# Patient Record
Sex: Female | Born: 1970
Health system: Southern US, Community
[De-identification: ages and names within clinical notes are randomized; demographics above are authoritative.]

## PROBLEM LIST (undated history)

## (undated) DIAGNOSIS — I10 Essential (primary) hypertension: Secondary | ICD-10-CM

## (undated) DIAGNOSIS — L400 Psoriasis vulgaris: Secondary | ICD-10-CM

## (undated) HISTORY — PX: TUBAL LIGATION: SHX77

---

## 2015-11-22 ENCOUNTER — Encounter (HOSPITAL_BASED_OUTPATIENT_CLINIC_OR_DEPARTMENT_OTHER): Payer: Self-pay | Admitting: *Deleted

## 2015-11-22 ENCOUNTER — Emergency Department (HOSPITAL_BASED_OUTPATIENT_CLINIC_OR_DEPARTMENT_OTHER)
Admission: EM | Admit: 2015-11-22 | Discharge: 2015-11-22 | Disposition: A | Discharge status: Home / Self Care | Payer: Self-pay | Attending: Emergency Medicine | Admitting: Emergency Medicine

## 2015-11-22 DIAGNOSIS — B356 Tinea cruris: Secondary | ICD-10-CM | POA: Insufficient documentation

## 2015-11-22 DIAGNOSIS — F17200 Nicotine dependence, unspecified, uncomplicated: Secondary | ICD-10-CM | POA: Insufficient documentation

## 2015-11-22 DIAGNOSIS — Z79899 Other long term (current) drug therapy: Secondary | ICD-10-CM | POA: Insufficient documentation

## 2015-11-22 DIAGNOSIS — I10 Essential (primary) hypertension: Secondary | ICD-10-CM | POA: Insufficient documentation

## 2015-11-22 DIAGNOSIS — R21 Rash and other nonspecific skin eruption: Secondary | ICD-10-CM

## 2015-11-22 HISTORY — DX: Essential (primary) hypertension: I10

## 2015-11-22 LAB — COMPREHENSIVE METABOLIC PANEL
ALBUMIN: 4.1 g/dL (ref 3.5–5.0)
ALK PHOS: 55 U/L (ref 38–126)
ALT: 14 U/L (ref 14–54)
ANION GAP: 6 (ref 5–15)
AST: 17 U/L (ref 15–41)
BILIRUBIN TOTAL: 0.5 mg/dL (ref 0.3–1.2)
BUN: 11 mg/dL (ref 6–20)
CALCIUM: 8.7 mg/dL — AB (ref 8.9–10.3)
CO2: 26 mmol/L (ref 22–32)
Chloride: 106 mmol/L (ref 101–111)
Creatinine, Ser: 0.69 mg/dL (ref 0.44–1.00)
GFR calc Af Amer: >60 mL/min (ref >60)
GFR calc non Af Amer: >60 mL/min (ref >60)
GLUCOSE: 88 mg/dL (ref 65–99)
Potassium: 3.9 mmol/L (ref 3.5–5.1)
SODIUM: 138 mmol/L (ref 135–145)
TOTAL PROTEIN: 7.5 g/dL (ref 6.5–8.1)

## 2015-11-22 LAB — RAPID HIV SCREEN (HIV 1/2 AB+AG)
HIV 1/2 Antibodies: NONREACTIVE
HIV-1 P24 Antigen - HIV24: NONREACTIVE

## 2015-11-22 LAB — CBC WITH DIFFERENTIAL/PLATELET
BASOS ABS: 0 10*3/uL (ref 0.0–0.1)
BASOS PCT: 0 %
EOS ABS: 0.1 10*3/uL (ref 0.0–0.7)
Eosinophils Relative: 1 %
HEMATOCRIT: 37.2 % (ref 36.0–46.0)
HEMOGLOBIN: 13.1 g/dL (ref 12.0–15.0)
Lymphocytes Relative: 49 %
Lymphs Abs: 2.4 10*3/uL (ref 0.7–4.0)
MCH: 31 pg (ref 26.0–34.0)
MCHC: 35.2 g/dL (ref 30.0–36.0)
MCV: 87.9 fL (ref 78.0–100.0)
Monocytes Absolute: 0.4 10*3/uL (ref 0.1–1.0)
Monocytes Relative: 9 %
NEUTROS ABS: 2 10*3/uL (ref 1.7–7.7)
NEUTROS PCT: 41 %
Platelets: 303 10*3/uL (ref 150–400)
RBC: 4.23 MIL/uL (ref 3.87–5.11)
RDW: 13.1 % (ref 11.5–15.5)
WBC: 4.9 10*3/uL (ref 4.0–10.5)

## 2015-11-22 MED ORDER — KETOCONAZOLE 2 % EX CREA: 1.0000 "application " | TOPICAL_CREAM | Freq: Every day | CUTANEOUS | 0 refills | Status: AC

## 2015-11-22 MED ORDER — SODIUM CHLORIDE 0.9 % IV BOLUS (SEPSIS)
1000.0000 mL | Freq: Once | INTRAVENOUS | Status: AC
  Administered 2015-11-22: 1000 mL via INTRAVENOUS

## 2015-11-22 MED ORDER — HYDROXYZINE HCL 25 MG PO TABS: 25.0000 mg | ORAL_TABLET | Freq: Four times a day (QID) | ORAL | 0 refills | Status: DC

## 2015-11-22 MED ORDER — FLUCONAZOLE 50 MG PO TABS
150.0000 mg | ORAL_TABLET | Freq: Every day | ORAL | Status: DC
  Administered 2015-11-22: 150 mg via ORAL
  Filled 2015-11-22: qty 1

## 2015-11-22 MED ORDER — DIPHENHYDRAMINE HCL 50 MG/ML IJ SOLN
25.0000 mg | Freq: Once | INTRAMUSCULAR | Status: AC
  Administered 2015-11-22: 25 mg via INTRAVENOUS
  Filled 2015-11-22: qty 1

## 2015-11-22 MED FILL — hydrOXYzine HCL 25 MG TABS: 25 | 3 days supply | Qty: 12 | Fill #0

## 2015-11-22 MED FILL — KETOCONAZOLE 2% CREAM: 2 | 14 days supply | Qty: 30 | Fill #0

## 2015-11-22 NOTE — ED Provider Notes (Signed)
[facility] Provider Note   CSN: [id number] Arrival date & time: 11/22/15  1217  First Provider Contact:  None       History   Chief Complaint Chief Complaint  Patient presents with  . Rash    HPI   Patient with pruritic rash beginning on the lower legs 2 weeks ago and spreading up to her vulva, groin, and lower abdomen where it is now most concentrated. Also the right palm and BL arms. Denies contacts with similar,changes in lotions/soaps/detergents, exposure to animal or plant irritants, and denies purulent discharge. Tried atarax and    The history is provided by the patient.  Rash   This is a new problem. Episode onset: 2 weeks ago. The problem has been rapidly worsening. The problem is associated with an unknown factor. There has been no fever. The rash is present on the genitalia, right hand, right arm, left arm, left upper leg, left lower leg, left ankle, right upper leg, right lower leg, right ankle and abdomen. The pain is at a severity of 0/10. Associated symptoms include itching. She has tried steriods and antihistamines for the symptoms. The treatment provided no relief.    Past Medical History:  Diagnosis Date  . Hypertension     There are no active problems to display for this patient.   Past Surgical History:  Procedure Laterality Date  . TUBAL LIGATION      OB History    No data available       Home Medications    Prior to Admission medications   Medication Sig Start Date End Date Taking? Authorizing Provider  hydrochlorothiazide (HYDRODIURIL) 25 MG tablet Take 25 mg by mouth daily.   Yes Historical Provider, MD    Family History No family history on file.  Social History Social History  Substance Use Topics  . Smoking status: Current Every Day Smoker  . Smokeless tobacco: Never Used  . Alcohol use No     Allergies   Penicillins   Review of Systems Review of Systems  Constitutional: Negative for fever.  Skin:  Positive for itching and rash.  Ten systems reviewed and are negative for acute change, except as noted in the HPI.     Physical Exam Updated Vital Signs BP 174/88 (BP Location: Right Arm)   Pulse (!) 50   Temp 99.1 F (37.3 C) (Oral)   Resp 18   Ht  (1.6 m)   Wt 79.8 kg   LMP 11/17/2015   SpO2 100%   BMI 31.18 kg/m   Physical Exam  Constitutional: She is oriented to person, place, and time. She appears well-developed and well-nourished. No distress.  HENT:  Head: Normocephalic and atraumatic.  Eyes: Conjunctivae are normal. No scleral icterus.  Neck: Normal range of motion.  Cardiovascular: Normal rate, regular rhythm and normal heart sounds.  Exam reveals no gallop and no friction rub.   No murmur heard. Pulmonary/Chest: Effort normal and breath sounds normal. No respiratory distress.  Abdominal: Soft. Bowel sounds are normal. She exhibits no distension and no mass. There is no tenderness. There is no guarding.  Neurological: She is alert and oriented to person, place, and time.  Skin: Skin is warm and dry. Rash noted. She is not diaphoretic.  Multiple singular, hyperpigmented plaques, and coin like lesions,  of varying size involving the legs, vulva, lower abdomen, arms, and the R palm. The vulva also has extension of hypertropic, hyprpigmented skin into the groin and medial thighs  that is well demarcated  Nursing note and vitals reviewed.          ED Treatments / Results  Labs (all labs ordered are listed, but only abnormal results are displayed) Labs Reviewed  CBC WITH DIFFERENTIAL/PLATELET  COMPREHENSIVE METABOLIC PANEL  RAPID HIV SCREEN (HIV 1/2 AB+AG)  RPR    EKG  EKG Interpretation None       Radiology No results found.  Procedures Procedures (including critical care time)  Medications Ordered in ED Medications  sodium chloride 0.9 % bolus 1,000 mL (not administered)  diphenhydrAMINE (BENADRYL) injection 25 mg (not administered)      Initial Impression / Assessment and Plan / ED Course  I have reviewed the triage vital signs and the nursing notes.  Pertinent labs & imaging results that were available during my care of the patient were reviewed by me and considered in my medical decision making (see chart for details).  Clinical Course    Patient with rash, labs reviewed and no significant abnormality, negative rapid HIV today. The patient's RPR is pending. I discussed possibility of syphilis as a cause of the rash on her legs, arms and hands. Patient's itching is predominantly in the groin area feels she has a kidney infection. She is given 1 dose of by mouth Diflucan and will be discharged with ketoconazole 2% cream for topical application in the groin and vulva. Discussed safe sexual practices, especially while her results are pending. Patient is encouraged to follow up with the dermatologist. Avoid letting others have direct contact with the lesions until diagnosis is achieved. Discussed return precautions with the patient to include worsening swelling, redness, pain, discharge or other signs of cellulitis or abscess. She appears safe for discharge at this time.  Final Clinical Impressions(s) / ED Diagnoses   Final diagnoses:  None    New Prescriptions New Prescriptions   No medications on file     Arthor Captainbigail Kacin Dancy, PA-C 11/22/15 1631    Charlynne Panderavid Hsienta Yao, MD 11/23/15 [phone]

## 2015-11-22 NOTE — ED Triage Notes (Signed)
Rash for several weeks. Itching. Has dermatologly appt on 8/25. Denies fever

## 2015-11-23 LAB — RPR: RPR: NONREACTIVE

## 2020-02-18 ENCOUNTER — Emergency Department (HOSPITAL_BASED_OUTPATIENT_CLINIC_OR_DEPARTMENT_OTHER)
Admission: EM | Admit: 2020-02-18 | Discharge: 2020-02-18 | Disposition: A | Discharge status: Home / Self Care | Payer: Self-pay | Attending: Emergency Medicine | Admitting: Emergency Medicine

## 2020-02-18 ENCOUNTER — Other Ambulatory Visit (HOSPITAL_BASED_OUTPATIENT_CLINIC_OR_DEPARTMENT_OTHER): Payer: Self-pay | Admitting: Emergency Medicine

## 2020-02-18 ENCOUNTER — Other Ambulatory Visit: Payer: Self-pay

## 2020-02-18 ENCOUNTER — Encounter (HOSPITAL_BASED_OUTPATIENT_CLINIC_OR_DEPARTMENT_OTHER): Payer: Self-pay

## 2020-02-18 DIAGNOSIS — I10 Essential (primary) hypertension: Secondary | ICD-10-CM | POA: Insufficient documentation

## 2020-02-18 DIAGNOSIS — Z9851 Tubal ligation status: Secondary | ICD-10-CM | POA: Insufficient documentation

## 2020-02-18 DIAGNOSIS — Z79899 Other long term (current) drug therapy: Secondary | ICD-10-CM | POA: Insufficient documentation

## 2020-02-18 DIAGNOSIS — K644 Residual hemorrhoidal skin tags: Secondary | ICD-10-CM | POA: Insufficient documentation

## 2020-02-18 DIAGNOSIS — Z87891 Personal history of nicotine dependence: Secondary | ICD-10-CM | POA: Insufficient documentation

## 2020-02-18 HISTORY — DX: Psoriasis vulgaris: L40.0

## 2020-02-18 MED ORDER — HYDROCODONE-ACETAMINOPHEN 5-325 MG PO TABS: 1.0000 | ORAL_TABLET | Freq: Four times a day (QID) | ORAL | 0 refills | Status: DC | PRN

## 2020-02-18 MED FILL — HYDROCODON-APAP 5-325: 5-325 | 4 days supply | Qty: 14 | Fill #0

## 2020-02-18 NOTE — Discharge Instructions (Signed)
Work-up today shows evidence of an external hemorrhoid that probably has a component of thrombosis in it.  Take the pain medicine as directed.  Follow-up with your doctors or return for any new or worse symptoms.  Also would recommend soaking in warm tub water for 20 minutes once or twice a day.

## 2020-02-18 NOTE — ED Triage Notes (Signed)
Pt c/o hemorrhoids x "years" -worse x 2 days-NAD-steady gait

## 2020-02-18 NOTE — ED Provider Notes (Signed)
[facility] EMERGENCY DEPARTMENT Provider Note   CSN: [id number] Arrival date & time: 02/18/20  1131     History Chief Complaint  Patient presents with  . Hemorrhoids    Bridgett Golberg is a 49 y.o. female.  Patient with complaint of anal pain for 2 to 3 days.  Patient feels that it secondary to hemorrhoid problem.  No bleeding.  No fever.        Past Medical History:  Diagnosis Date  . Hypertension   . Plaque psoriasis     There are no problems to display for this patient.   Past Surgical History:  Procedure Laterality Date  . TUBAL LIGATION       OB History   No obstetric history on file.     No family history on file.  Social History   Tobacco Use  . Smoking status: Former Games developer  . Smokeless tobacco: Never Used  Vaping Use  . Vaping Use: Never used  Substance Use Topics  . Alcohol use: No  . Drug use: No    Home Medications Prior to Admission medications   Medication Sig Start Date End Date Taking? Authorizing Provider  hydrochlorothiazide (HYDRODIURIL) 25 MG tablet Take 25 mg by mouth daily.    [provider]  HYDROcodone-acetaminophen (NORCO/VICODIN) 5-325 MG tablet Take 1 tablet by mouth every 6 (six) hours as needed for moderate pain. 02/18/20   Vanetta Mulders, MD  hydrOXYzine (ATARAX/VISTARIL) 25 MG tablet Take 1 tablet (25 mg total) by mouth every 6 (six) hours. 11/22/15   Arthor Captain, PA-C  ketoconazole (NIZORAL) 2 % cream Apply 1 application topically daily. Apply to the outside of the genitals and the groin. 11/22/15   Arthor Captain, PA-C    Allergies    Penicillins  Review of Systems   Review of Systems  Constitutional: Negative for chills and fever.  HENT: Negative for rhinorrhea and sore throat.   Eyes: Negative for visual disturbance.  Respiratory: Negative for cough and shortness of breath.   Cardiovascular: Negative for chest pain and leg swelling.  Gastrointestinal: Positive for rectal pain. Negative  for abdominal pain, blood in stool, diarrhea, nausea and vomiting.  Genitourinary: Negative for dysuria.  Musculoskeletal: Negative for back pain and neck pain.  Skin: Negative for rash.  Neurological: Negative for dizziness, light-headedness and headaches.  Hematological: Does not bruise/bleed easily.  Psychiatric/Behavioral: Negative for confusion.    Physical Exam Updated Vital Signs BP (!) 156/72 (BP Location: Right Arm)   Pulse 64   Temp 98.6 F (37 C) (Oral)   Resp 18   Ht 1.6 m (5\' 3" )   Wt 85.7 kg   LMP 11/17/2015   SpO2 100%   BMI 33.48 kg/m   Physical Exam Vitals and nursing note reviewed.  Constitutional:      General: She is not in acute distress.    Appearance: Normal appearance. She is well-developed.  HENT:     Head: Normocephalic and atraumatic.  Eyes:     Extraocular Movements: Extraocular movements intact.     Conjunctiva/sclera: Conjunctivae normal.     Pupils: Pupils are equal, round, and reactive to light.  Cardiovascular:     Rate and Rhythm: Normal rate and regular rhythm.     Heart sounds: No murmur heard.   Pulmonary:     Effort: Pulmonary effort is normal. No respiratory distress.     Breath sounds: Normal breath sounds.  Abdominal:     Palpations: Abdomen is soft.  Tenderness: There is no abdominal tenderness.  Genitourinary:    Comments: Perianal area with a large external hemorrhoid.  With some tenderness.  Node distinct of visual evidence of thrombosis.  But most likely has some component of that.  No bleeding.  Rectal exam not done due to the external hemorrhoid discomfort. Musculoskeletal:     Cervical back: Normal range of motion and neck supple.  Skin:    General: Skin is warm and dry.  Neurological:     General: No focal deficit present.     Mental Status: She is alert and oriented to person, place, and time.     Cranial Nerves: No cranial nerve deficit.     Sensory: No sensory deficit.     ED Results / Procedures /  Treatments   Labs (all labs ordered are listed, but only abnormal results are displayed) Labs Reviewed - No data to display  EKG None  Radiology No results found.  Procedures Procedures (including critical care time)  Medications Ordered in ED Medications - No data to display  ED Course  I have reviewed the triage vital signs and the nursing notes.  Pertinent labs & imaging results that were available during my care of the patient were reviewed by me and considered in my medical decision making (see chart for details).    MDM Rules/Calculators/A&P                           Exam consistent with external hemorrhoid probably with a degree of thrombosis.  But no direct evidence of thrombosis to open up.  No active bleeding.  Will treat symptomatically with pain medicine and soaks.  Patient will follow up with primary care provider return for any new or worse symptoms.     Final Clinical Impression(s) / ED Diagnoses Final diagnoses:  External hemorrhoid    Rx / DC Orders ED Discharge Orders         Ordered    HYDROcodone-acetaminophen (NORCO/VICODIN) 5-325 MG tablet  Every 6 hours PRN        02/18/20 1418           Vanetta Mulders, MD 02/18/20 1423

## 2020-05-20 ENCOUNTER — Emergency Department (HOSPITAL_BASED_OUTPATIENT_CLINIC_OR_DEPARTMENT_OTHER): Payer: BC Managed Care – PPO

## 2020-05-20 ENCOUNTER — Other Ambulatory Visit (HOSPITAL_BASED_OUTPATIENT_CLINIC_OR_DEPARTMENT_OTHER): Payer: Self-pay | Admitting: Emergency Medicine

## 2020-05-20 ENCOUNTER — Emergency Department (HOSPITAL_BASED_OUTPATIENT_CLINIC_OR_DEPARTMENT_OTHER)
Admission: EM | Admit: 2020-05-20 | Discharge: 2020-05-20 | Disposition: A | Discharge status: Home / Self Care | Payer: BC Managed Care – PPO | Attending: Emergency Medicine | Admitting: Emergency Medicine

## 2020-05-20 ENCOUNTER — Encounter (HOSPITAL_BASED_OUTPATIENT_CLINIC_OR_DEPARTMENT_OTHER): Payer: Self-pay | Admitting: *Deleted

## 2020-05-20 ENCOUNTER — Other Ambulatory Visit: Payer: Self-pay

## 2020-05-20 DIAGNOSIS — Z87891 Personal history of nicotine dependence: Secondary | ICD-10-CM | POA: Insufficient documentation

## 2020-05-20 DIAGNOSIS — M7989 Other specified soft tissue disorders: Secondary | ICD-10-CM | POA: Insufficient documentation

## 2020-05-20 DIAGNOSIS — M25461 Effusion, right knee: Secondary | ICD-10-CM | POA: Insufficient documentation

## 2020-05-20 DIAGNOSIS — I10 Essential (primary) hypertension: Secondary | ICD-10-CM | POA: Insufficient documentation

## 2020-05-20 DIAGNOSIS — G8929 Other chronic pain: Secondary | ICD-10-CM

## 2020-05-20 DIAGNOSIS — Z79899 Other long term (current) drug therapy: Secondary | ICD-10-CM | POA: Insufficient documentation

## 2020-05-20 DIAGNOSIS — M25561 Pain in right knee: Secondary | ICD-10-CM | POA: Insufficient documentation

## 2020-05-20 MED ORDER — NAPROXEN 375 MG PO TABS: 375.0000 mg | ORAL_TABLET | Freq: Two times a day (BID) | ORAL | 0 refills | Status: DC

## 2020-05-20 MED FILL — NAPROXEN 375 MG TABLET: 375 | 15 days supply | Qty: 30 | Fill #0
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
## 2020-05-20 NOTE — ED Triage Notes (Signed)
Right knee pain x 3 months. Pt is ambulatory.

## 2020-05-30 ENCOUNTER — Ambulatory Visit (INDEPENDENT_AMBULATORY_CARE_PROVIDER_SITE_OTHER): Payer: BC Managed Care – PPO | Admitting: Family Medicine

## 2020-05-30 ENCOUNTER — Ambulatory Visit: Payer: Self-pay

## 2020-05-30 ENCOUNTER — Other Ambulatory Visit: Payer: Self-pay

## 2020-05-30 VITALS — BP 140/82 | Ht 63.0 in | Wt 183.0 lb

## 2020-05-30 DIAGNOSIS — S82141A Displaced bicondylar fracture of right tibia, initial encounter for closed fracture: Secondary | ICD-10-CM | POA: Insufficient documentation

## 2020-05-30 DIAGNOSIS — M1710 Unilateral primary osteoarthritis, unspecified knee: Secondary | ICD-10-CM | POA: Insufficient documentation

## 2020-05-30 DIAGNOSIS — M898X6 Other specified disorders of bone, lower leg: Secondary | ICD-10-CM

## 2020-05-30 DIAGNOSIS — M25561 Pain in right knee: Secondary | ICD-10-CM

## 2020-05-30 DIAGNOSIS — M179 Osteoarthritis of knee, unspecified: Secondary | ICD-10-CM | POA: Insufficient documentation

## 2020-05-30 NOTE — Assessment & Plan Note (Signed)
Findings seem most consistent with stress change of the proximal tibia.  Does not seem to be related with the degenerative changes of the joint space or meniscus. -Counseled on home exercise therapy and supportive care. -Hinged knee brace. -Green sport insoles. -Provided work note. -Could consider further imaging.

## 2020-05-30 NOTE — Progress Notes (Signed)
  Amy Watts - 50 y.o. female MRN [id number]  Date of birth: Oct 11, 1970  SUBJECTIVE:  Including CC & ROS.  No chief complaint on file.   Amy Watts is a 50 y.o. female that is presenting with right knee pain.  Has been ongoing for a few weeks.  Pain is anterior nature.  It is worse at the end of the day.  No previous history of injury or surgery..  Independent review of the right knee x-ray from 2/4 degenerative changes appreciated in medial joint space.   Review of Systems See HPI   HISTORY: Past Medical, Surgical, Social, and Family History Reviewed & Updated per EMR.   Pertinent Historical Findings include:  Past Medical History:  Diagnosis Date  . Hypertension   . Plaque psoriasis     Past Surgical History:  Procedure Laterality Date  . TUBAL LIGATION      No family history on file.  Social History   Socioeconomic History  . Marital status: Single    Spouse name: Not on file  . Number of children: Not on file  . Years of education: Not on file  . Highest education level: Not on file  Occupational History  . Not on file  Tobacco Use  . Smoking status: Former Games developer  . Smokeless tobacco: Never Used  Vaping Use  . Vaping Use: Never used  Substance and Sexual Activity  . Alcohol use: No  . Drug use: No  . Sexual activity: Not on file  Other Topics Concern  . Not on file  Social History Narrative  . Not on file   Social Determinants of Health   Financial Resource Strain: Not on file  Food Insecurity: Not on file  Transportation Needs: Not on file  Physical Activity: Not on file  Stress: Not on file  Social Connections: Not on file  Intimate Partner Violence: Not on file     PHYSICAL EXAM:  VS: BP 140/82 (BP Location: Right Arm, Patient Position: Sitting, Cuff Size: Large)   Ht 5\' 3"  (1.6 m)   Wt 183 lb (83 kg)   LMP 11/17/2015   BMI 32.42 kg/m  Physical Exam Gen: NAD, alert, cooperative with exam, well-appearing MSK:  Right knee: No  obvious effusion. Tenderness to palpation over the proximal tibia. Normal strength resistance. No instability. Neurovascular intact  Limited ultrasound: Right knee:  Mild effusion. Normal-appearing quadricep tendon. Chronic changes at the insertion of the patellar tendon. Increased hyperemia surrounding the proximal tibial plateau which could indicate a stress change. Mild degenerative changes of the meniscus and joint space medially  Summary: Findings seem most consistent with stress changes of the proximal tibia  Ultrasound and interpretation by 01/17/2016, MD    ASSESSMENT & PLAN:   Tibial pain Findings seem most consistent with stress change of the proximal tibia.  Does not seem to be related with the degenerative changes of the joint space or meniscus. -Counseled on home exercise therapy and supportive care. -Hinged knee brace. -Green sport insoles. -Provided work note. -Could consider further imaging.

## 2020-05-30 NOTE — Patient Instructions (Signed)
Nice to meet you Please try ice as needed  Please try the brace Please try the exercises   Please send me a message in MyChart with any questions or updates.  Please see me back in 4 weeks.   --Dr. Addisson Frate  

## 2020-06-27 ENCOUNTER — Ambulatory Visit (INDEPENDENT_AMBULATORY_CARE_PROVIDER_SITE_OTHER): Payer: BC Managed Care – PPO | Admitting: Family Medicine

## 2020-06-27 ENCOUNTER — Ambulatory Visit: Payer: Self-pay

## 2020-06-27 ENCOUNTER — Other Ambulatory Visit: Payer: Self-pay

## 2020-06-27 DIAGNOSIS — S82141D Displaced bicondylar fracture of right tibia, subsequent encounter for closed fracture with routine healing: Secondary | ICD-10-CM

## 2020-06-27 MED ORDER — TRIAMCINOLONE ACETONIDE 40 MG/ML IJ SUSP
40.0000 mg | Freq: Once | INTRAMUSCULAR | Status: AC
  Administered 2020-06-27: 40 mg via INTRA_ARTICULAR

## 2020-06-27 NOTE — Progress Notes (Signed)
  Amy Watts - 50 y.o. female MRN [id number]  Date of birth: [birth year]  SUBJECTIVE:  Including CC & ROS.  No chief complaint on file.   Amy Watts is a 50 y.o. female that is presenting with ongoing right knee pain.  The pain is occurring with any weightbearing.  Seems more anterior nature.  Has been using a hinged knee brace.  Does not feel she can work without restrictions since she has to stand for 12 hours at one time.   Review of Systems See HPI   HISTORY: Past Medical, Surgical, Social, and Family History Reviewed & Updated per EMR.   Pertinent Historical Findings include:  Past Medical History:  Diagnosis Date  . Hypertension   . Plaque psoriasis     Past Surgical History:  Procedure Laterality Date  . TUBAL LIGATION      No family history on file.  Social History   Socioeconomic History  . Marital status: Single    Spouse name: Not on file  . Number of children: Not on file  . Years of education: Not on file  . Highest education level: Not on file  Occupational History  . Not on file  Tobacco Use  . Smoking status: Former Games developer  . Smokeless tobacco: Never Used  Vaping Use  . Vaping Use: Never used  Substance and Sexual Activity  . Alcohol use: No  . Drug use: No  . Sexual activity: Not on file  Other Topics Concern  . Not on file  Social History Narrative  . Not on file   Social Determinants of Health   Financial Resource Strain: Not on file  Food Insecurity: Not on file  Transportation Needs: Not on file  Physical Activity: Not on file  Stress: Not on file  Social Connections: Not on file  Intimate Partner Violence: Not on file     PHYSICAL EXAM:  VS: BP 120/78   Ht 5\' 3"  (1.6 m)   Wt 183 lb (83 kg)   LMP 11/17/2015   BMI 32.42 kg/m  Physical Exam Gen: NAD, alert, cooperative with exam, well-appearing MSK:  Right knee: No swelling. Normal range of motion in flexion. Limited extension. Tenderness over the proximal tibial  plateau. Neurovascularly intact   Aspiration/Injection Procedure Note Amy Watts May 13, 1970  Procedure: Injection Indications: Right knee pain  Procedure Details Consent: Risks of procedure as well as the alternatives and risks of each were explained to the (patient/caregiver).  Consent for procedure obtained. Time Out: Verified patient identification, verified procedure, site/side was marked, verified correct patient position, special equipment/implants available, medications/allergies/relevent history reviewed, required imaging and test results available.  Performed.  The area was cleaned with iodine and alcohol swabs.    The right knee superior lateral suprapatellar pouch was injected using 1 cc's of 40 mg Kenalog and 4 cc's of 0.25% bupivacaine with a 22 1 1/2" needle.  Ultrasound was used. Images were obtained in long views showing the injection.     A sterile dressing was applied.  Patient did tolerate procedure well.     ASSESSMENT & PLAN:   Tibial plateau fracture, right Acute worsening of her ongoing right knee pain.  Concern about fracture given her symptoms and ultrasound findings.  Possible for degenerative change of the meniscus. -Counseled on home exercise therapy and supportive care. - injection - MRi to evaluate for tibial plateau fracture

## 2020-06-27 NOTE — Assessment & Plan Note (Addendum)
Acute worsening of her ongoing right knee pain.  Concern about fracture given her symptoms and ultrasound findings.  Possible for degenerative change of the meniscus. -Counseled on home exercise therapy and supportive care. - injection - MRi to evaluate for tibial plateau fracture

## 2020-06-27 NOTE — Patient Instructions (Signed)
Good to see you Please continue brace Please try ice as needed   Please send me a message in MyChart with any questions or updates.  We will set up a virtual visit once the MRi is resulted.   --Dr. Jordan Likes

## 2020-06-27 NOTE — Addendum Note (Signed)
Addended by: Annita Brod on: 06/27/2020 11:47 AM   Modules accepted: Orders

## 2020-06-28 ENCOUNTER — Telehealth: Payer: Self-pay | Admitting: Family Medicine

## 2020-06-28 NOTE — Telephone Encounter (Signed)
Forwarding message to Provider.  [employer] rep Rhunette Croft @ [phone] - Patient has requested MRI be done @ [facility]  Ph#) [phone]-- [phone]  AG[facility]#[id number] Valid frm 3/14-12/23/20  --glh

## 2020-07-02 ENCOUNTER — Other Ambulatory Visit: Payer: Self-pay

## 2020-07-02 ENCOUNTER — Ambulatory Visit (HOSPITAL_BASED_OUTPATIENT_CLINIC_OR_DEPARTMENT_OTHER)
Admission: RE | Admit: 2020-07-02 | Discharge: 2020-07-02 | Disposition: A | Discharge status: Home / Self Care | Payer: BC Managed Care – PPO | Source: Ambulatory Visit | Attending: Family Medicine | Admitting: Family Medicine

## 2020-07-02 DIAGNOSIS — S82141D Displaced bicondylar fracture of right tibia, subsequent encounter for closed fracture with routine healing: Secondary | ICD-10-CM | POA: Insufficient documentation

## 2020-07-06 ENCOUNTER — Other Ambulatory Visit: Payer: Self-pay

## 2020-07-06 ENCOUNTER — Telehealth (INDEPENDENT_AMBULATORY_CARE_PROVIDER_SITE_OTHER): Payer: BC Managed Care – PPO | Admitting: Family Medicine

## 2020-07-06 DIAGNOSIS — M1711 Unilateral primary osteoarthritis, right knee: Secondary | ICD-10-CM

## 2020-07-06 MED ORDER — PENNSAID 2 % EX SOLN: 1.0000 "application " | Freq: Two times a day (BID) | CUTANEOUS | 2 refills | Status: AC

## 2020-07-06 NOTE — Assessment & Plan Note (Signed)
Has meniscal tears as well as moderate degenerative changes of the medial compartment.  We have tried steroid injections and medications. -Counseled on home exercise therapy supportive care -[place]. -Pursue gel injections.

## 2020-07-06 NOTE — Progress Notes (Signed)
Virtual Visit via Video Note  I connected with Amy Watts on 07/06/20 at  8:10 AM EDT by a video enabled telemedicine application and verified that I am speaking with the correct person using two identifiers.  Location: Patient: home Provider: office   I discussed the limitations of evaluation and management by telemedicine and the availability of in person appointments. The patient expressed understanding and agreed to proceed.  History of Present Illness:  Ms. Amy Watts is a 50 year old female that is following up after MRI of her right knee.  This was demonstrating moderate degenerative changes as well as complex tears of the posterior horn of the medial meniscus.   Observations/Objective:  Gen: NAD, alert, cooperative with exam, well-appearing  Assessment and Plan:  Osteoarthritis right knee: Has meniscal tears as well as moderate degenerative changes of the medial compartment.  We have tried steroid injections and medications. -Counseled on home exercise therapy supportive care -[facility]. -Pursue gel injections.  Follow Up Instructions:    I discussed the assessment and treatment plan with the patient. The patient was provided an opportunity to ask questions and all were answered. The patient agreed with the plan and demonstrated an understanding of the instructions.   The patient was advised to call back or seek an in-person evaluation if the symptoms worsen or if the condition fails to improve as anticipated.    Amy Gandy, MD

## 2020-07-19 ENCOUNTER — Encounter: Payer: Self-pay | Admitting: Family Medicine

## 2020-07-20 ENCOUNTER — Telehealth: Payer: Self-pay | Admitting: *Deleted

## 2020-07-20 NOTE — Telephone Encounter (Signed)
Left message for patient to call back to inform pt Amy Watts is approved. Patient will owe her usual copay then the rest of her OOP she has not already met. Pt is scheduled for 07/26/20.

## 2020-07-21 ENCOUNTER — Encounter (HOSPITAL_BASED_OUTPATIENT_CLINIC_OR_DEPARTMENT_OTHER): Payer: Self-pay | Admitting: *Deleted

## 2020-07-26 ENCOUNTER — Encounter: Payer: Self-pay | Admitting: Family Medicine

## 2020-07-26 ENCOUNTER — Ambulatory Visit (INDEPENDENT_AMBULATORY_CARE_PROVIDER_SITE_OTHER): Payer: BC Managed Care – PPO | Admitting: Family Medicine

## 2020-07-26 ENCOUNTER — Ambulatory Visit: Payer: Self-pay

## 2020-07-26 ENCOUNTER — Other Ambulatory Visit: Payer: Self-pay

## 2020-07-26 VITALS — BP 130/88 | Ht 63.0 in | Wt 183.0 lb

## 2020-07-26 DIAGNOSIS — M1711 Unilateral primary osteoarthritis, right knee: Secondary | ICD-10-CM

## 2020-07-26 NOTE — Progress Notes (Signed)
  Amy Watts - 50 y.o. female MRN [id number]  Date of birth: May 12, 1970  SUBJECTIVE:  Including CC & ROS.  No chief complaint on file.   Amy Watts is a 50 y.o. female that is presenting for her first gel injection..   Review of Systems See HPI   HISTORY: Past Medical, Surgical, Social, and Family History Reviewed & Updated per EMR.   Pertinent Historical Findings include:  Past Medical History:  Diagnosis Date  . Hypertension   . Plaque psoriasis     Past Surgical History:  Procedure Laterality Date  . TUBAL LIGATION      No family history on file.  Social History   Socioeconomic History  . Marital status: Single    Spouse name: Not on file  . Number of children: Not on file  . Years of education: Not on file  . Highest education level: Not on file  Occupational History  . Not on file  Tobacco Use  . Smoking status: Former Games developer  . Smokeless tobacco: Never Used  Vaping Use  . Vaping Use: Never used  Substance and Sexual Activity  . Alcohol use: No  . Drug use: No  . Sexual activity: Not on file  Other Topics Concern  . Not on file  Social History Narrative   ** Merged History Encounter **       Social Determinants of Health   Financial Resource Strain: Not on file  Food Insecurity: Not on file  Transportation Needs: Not on file  Physical Activity: Not on file  Stress: Not on file  Social Connections: Not on file  Intimate Partner Violence: Not on file     PHYSICAL EXAM:  VS: BP 130/88 (BP Location: Left Arm, Patient Position: Sitting, Cuff Size: Large)   Ht 5\' 3"  (1.6 m)   Wt 183 lb (83 kg)   LMP 11/17/2015   BMI 32.42 kg/m  Physical Exam Gen: NAD, alert, cooperative with exam, well-appearing   Aspiration/Injection Procedure Note Amy Watts [birth year]  Procedure: Injection Indications: Right knee pain  Procedure Details Consent: Risks of procedure as well as the alternatives and risks of each  were explained to the (patient/caregiver).  Consent for procedure obtained. Time Out: Verified patient identification, verified procedure, site/side was marked, verified correct patient position, special equipment/implants available, medications/allergies/relevent history reviewed, required imaging and test results available.  Performed.  The area was cleaned with iodine and alcohol swabs.    The right knee superior lateral suprapatellar pouch was injected using 4 cc's of 1% lidocaine with a 21 2" needle.  The syringe was switched and a 16.8 mg/2 mL of Gelsyn 3 was injected. Ultrasound was used. Images were obtained in  Long views showing the injection.    A sterile dressing was applied.  Patient did tolerate procedure well.    ASSESSMENT & PLAN:   OA (osteoarthritis) of knee Completed first gel injection  - f/u in one week.

## 2020-07-26 NOTE — Assessment & Plan Note (Addendum)
Completed first gel injection  - f/u in one week.  - provided work note

## 2020-07-26 NOTE — Patient Instructions (Signed)
Good to see you Please use ice as needed  Please send me a message in MyChart with any questions or updates.  Please see me back in 1 week.   --Dr. Anjelica Gorniak  

## 2020-08-02 ENCOUNTER — Ambulatory Visit: Payer: Self-pay

## 2020-08-02 ENCOUNTER — Other Ambulatory Visit: Payer: Self-pay

## 2020-08-02 ENCOUNTER — Ambulatory Visit (INDEPENDENT_AMBULATORY_CARE_PROVIDER_SITE_OTHER): Payer: BC Managed Care – PPO | Admitting: Family Medicine

## 2020-08-02 ENCOUNTER — Encounter: Payer: Self-pay | Admitting: Family Medicine

## 2020-08-02 VITALS — BP 146/84 | Ht 63.0 in | Wt 183.0 lb

## 2020-08-02 DIAGNOSIS — M1711 Unilateral primary osteoarthritis, right knee: Secondary | ICD-10-CM

## 2020-08-02 NOTE — Assessment & Plan Note (Signed)
Completed second Gelsyn injection.  - f/u in one week.

## 2020-08-02 NOTE — Patient Instructions (Signed)
Good to see you Please try ice as needed   Please send me a message in MyChart with any questions or updates.  Please see me back in 1 week.   --Dr. Taelynn Mcelhannon  

## 2020-08-02 NOTE — Progress Notes (Signed)
  Amy Watts - 50 y.o. female MRN [id number]  Date of birth: 12/19/1970  SUBJECTIVE:  Including CC & ROS.  No chief complaint on file.   Amy Watts is a 50 y.o. female that is here for her second gel injection   Review of Systems See HPI   HISTORY: Past Medical, Surgical, Social, and Family History Reviewed & Updated per EMR.   Pertinent Historical Findings include:  Past Medical History:  Diagnosis Date  . Hypertension   . Plaque psoriasis     Past Surgical History:  Procedure Laterality Date  . TUBAL LIGATION      No family history on file.  Social History   Socioeconomic History  . Marital status: Single    Spouse name: Not on file  . Number of children: Not on file  . Years of education: Not on file  . Highest education level: Not on file  Occupational History  . Not on file  Tobacco Use  . Smoking status: Former Games developer  . Smokeless tobacco: Never Used  Vaping Use  . Vaping Use: Never used  Substance and Sexual Activity  . Alcohol use: No  . Drug use: No  . Sexual activity: Not on file  Other Topics Concern  . Not on file  Social History Narrative   ** Merged History Encounter **       Social Determinants of Health   Financial Resource Strain: Not on file  Food Insecurity: Not on file  Transportation Needs: Not on file  Physical Activity: Not on file  Stress: Not on file  Social Connections: Not on file  Intimate Partner Violence: Not on file     PHYSICAL EXAM:  VS: BP (!) 146/84 (BP Location: Left Arm, Patient Position: Sitting, Cuff Size: Large)   Ht 5\' 3"  (1.6 m)   Wt 183 lb (83 kg)   LMP 11/17/2015   BMI 32.42 kg/m  Physical Exam Gen: NAD, alert, cooperative with exam, well-appearing    Aspiration/Injection Procedure Note Amy Watts Feb 22, 1971  Procedure: Injection Indications: right knee pain   Procedure Details Consent: Risks of procedure as well as the alternatives and risks of each  were explained to the (patient/caregiver).  Consent for procedure obtained. Time Out: Verified patient identification, verified procedure, site/side was marked, verified correct patient position, special equipment/implants available, medications/allergies/relevent history reviewed, required imaging and test results available.  Performed.  The area was cleaned with iodine and alcohol swabs.    The right knee superior lateral suprapatellar pouch was injected using 4 cc's of 1% lidocaine with a 22 1 1/2" needle.  The syringe was switched and a 16.8 mg/2 mL of Gelsyn 3 was injected. Ultrasound was used. Images were obtained in  Long views showing the injection.    A sterile dressing was applied.  Patient did tolerate procedure well.     ASSESSMENT & PLAN:   OA (osteoarthritis) of knee Completed second Gelsyn injection.  - f/u in one week.

## 2020-08-09 ENCOUNTER — Ambulatory Visit: Payer: Self-pay

## 2020-08-09 ENCOUNTER — Emergency Department (HOSPITAL_BASED_OUTPATIENT_CLINIC_OR_DEPARTMENT_OTHER)
Admission: EM | Admit: 2020-08-09 | Discharge: 2020-08-09 | Disposition: A | Discharge status: Home / Self Care | Payer: BC Managed Care – PPO | Attending: Emergency Medicine | Admitting: Emergency Medicine

## 2020-08-09 ENCOUNTER — Ambulatory Visit (INDEPENDENT_AMBULATORY_CARE_PROVIDER_SITE_OTHER): Payer: BC Managed Care – PPO | Admitting: Family Medicine

## 2020-08-09 ENCOUNTER — Other Ambulatory Visit: Payer: Self-pay

## 2020-08-09 ENCOUNTER — Encounter (HOSPITAL_BASED_OUTPATIENT_CLINIC_OR_DEPARTMENT_OTHER): Payer: Self-pay

## 2020-08-09 ENCOUNTER — Encounter: Payer: Self-pay | Admitting: Family Medicine

## 2020-08-09 VITALS — BP 144/88 | Ht 63.0 in | Wt 183.0 lb

## 2020-08-09 DIAGNOSIS — N898 Other specified noninflammatory disorders of vagina: Secondary | ICD-10-CM

## 2020-08-09 DIAGNOSIS — Z79899 Other long term (current) drug therapy: Secondary | ICD-10-CM | POA: Diagnosis not present

## 2020-08-09 DIAGNOSIS — R300 Dysuria: Secondary | ICD-10-CM | POA: Diagnosis not present

## 2020-08-09 DIAGNOSIS — M1711 Unilateral primary osteoarthritis, right knee: Secondary | ICD-10-CM

## 2020-08-09 DIAGNOSIS — Z87891 Personal history of nicotine dependence: Secondary | ICD-10-CM | POA: Diagnosis not present

## 2020-08-09 DIAGNOSIS — I10 Essential (primary) hypertension: Secondary | ICD-10-CM | POA: Insufficient documentation

## 2020-08-09 LAB — URINALYSIS, ROUTINE W REFLEX MICROSCOPIC
Bilirubin Urine: NEGATIVE
Glucose, UA: NEGATIVE mg/dL
Hgb urine dipstick: NEGATIVE
Ketones, ur: NEGATIVE mg/dL
Leukocytes,Ua: NEGATIVE
Nitrite: NEGATIVE
Protein, ur: NEGATIVE mg/dL
Specific Gravity, Urine: 1.025 (ref 1.005–1.030)
pH: 5 (ref 5.0–8.0)

## 2020-08-09 LAB — WET PREP, GENITAL
Clue Cells Wet Prep HPF POC: NONE SEEN
Sperm: NONE SEEN
Trich, Wet Prep: NONE SEEN
Yeast Wet Prep HPF POC: NONE SEEN

## 2020-08-09 NOTE — Patient Instructions (Signed)
Good to see you Please try ice as needed   Please send me a message in MyChart with any questions or updates.  Please see me back in 4 weeks.   --Dr. Charlis Harner  

## 2020-08-09 NOTE — Progress Notes (Signed)
  Amy Watts - 50 y.o. female MRN [id number]  Date of birth: 1970/08/07  SUBJECTIVE:  Including CC & ROS.  No chief complaint on file.   Amy Watts is a 50 y.o. female that is presenting for her third Gelsyn injection.   Review of Systems See HPI   HISTORY: Past Medical, Surgical, Social, and Family History Reviewed & Updated per EMR.   Pertinent Historical Findings include:  Past Medical History:  Diagnosis Date  . Hypertension   . Plaque psoriasis     Past Surgical History:  Procedure Laterality Date  . TUBAL LIGATION      History reviewed. No pertinent family history.  Social History   Socioeconomic History  . Marital status: Single    Spouse name: Not on file  . Number of children: Not on file  . Years of education: Not on file  . Highest education level: Not on file  Occupational History  . Not on file  Tobacco Use  . Smoking status: Former Games developer  . Smokeless tobacco: Never Used  Vaping Use  . Vaping Use: Never used  Substance and Sexual Activity  . Alcohol use: No  . Drug use: No  . Sexual activity: Not on file  Other Topics Concern  . Not on file  Social History Narrative   ** Merged History Encounter **       Social Determinants of Health   Financial Resource Strain: Not on file  Food Insecurity: Not on file  Transportation Needs: Not on file  Physical Activity: Not on file  Stress: Not on file  Social Connections: Not on file  Intimate Partner Violence: Not on file     PHYSICAL EXAM:  VS: BP (!) 144/88 (BP Location: Left Arm, Patient Position: Sitting, Cuff Size: Large)   Ht 5\' 3"  (1.6 m)   Wt 183 lb (83 kg)   LMP 11/17/2015   BMI 32.42 kg/m  Physical Exam Gen: NAD, alert, cooperative with exam, well-appearing     Aspiration/Injection Procedure Note Bettina Volante Leonardo March 17, 1971  Procedure: Injection Indications: right knee pain    Procedure Details Consent: Risks of procedure as well as the  alternatives and risks of each were explained to the (patient/caregiver).  Consent for procedure obtained. Time Out: Verified patient identification, verified procedure, site/side was marked, verified correct patient position, special equipment/implants available, medications/allergies/relevent history reviewed, required imaging and test results available.  Performed.  The area was cleaned with iodine and alcohol swabs.    The right knee superior lateral suprapatellar pouch was injected using 4 cc's of 1% lidocaine with a 22 1 1/2" needle.  The syringe was switched and a 16.8 mg/2 mL of Gelsyn 3 was injected. Ultrasound was used. Images were obtained in  Long views showing the injection.    A sterile dressing was applied.  Patient did tolerate procedure well.    ASSESSMENT & PLAN:   OA (osteoarthritis) of knee Completed third Gelsyn injection today. - f/u in 4 weeks

## 2020-08-09 NOTE — Discharge Instructions (Addendum)
Follow up in your MyChart account for your gonorrhea and chlamydia test results. Follow up with your PCP or GYN, if you need additional follow up resources, please see list below.  Your wet prep today is reassuring- negative for BV, trich, yeast. Your urine is negative for infection as well.   There are many options for quick evaluation and management of certain GYN issues that do not require a long wait time or big bill from the emergency department.   Consider these options for your care in the future:   Walk-ins for certain complaints available at:   [facility] [address]  [phone]  See hours at [email or web address]   [facility] [employer] [facility] [employer]  [address]  [phone]   [facility] [employer] [facility] [employer]  [phone]   You can make an appointment to see a GYN provider:   [facility] for [facility] at [facility]  [address]  [phone]   Center for [facility] at [facility]  [address] [employer]  [phone]   Center for [facility] at [facility]  [phone]   Center for [facility] at [facility]  [address], [address]  [phone]   If you already have an established OB/GYN provider in the area you can make an appointment with them as well.

## 2020-08-09 NOTE — ED Triage Notes (Signed)
Pt c/o vaginal d/c and dysuria x today-NAD-steady gait

## 2020-08-09 NOTE — ED Notes (Signed)
ED Provider at bedside. 

## 2020-08-09 NOTE — Assessment & Plan Note (Signed)
Completed third Gelsyn injection today. - f/u in 4 weeks

## 2020-08-09 NOTE — ED Notes (Signed)
Vaginal swabs sent to lab

## 2020-08-09 NOTE — ED Provider Notes (Signed)
[facility] EMERGENCY DEPARTMENT Provider Note   CSN: [id number] Arrival date & time: 08/09/20  1511     History Chief Complaint  Patient presents with  . Vaginal Discharge    Amy Watts is a 50 y.o. female.  50 year old female presents with complaint of vaginal itching and dysuria onset today.  Also reports thick white vaginal discharge.  Patient reports being in a monogamous relationship, her partner was tested for STDs yesterday and was negative.  Reports prior tubal ligation and denies concerns for pregnancy.  Denies abdominal pain or other complaints or concerns today.  Recent antibiotic use.        Past Medical History:  Diagnosis Date  . Hypertension   . Plaque psoriasis     Patient Active Problem List   Diagnosis Date Noted  . OA (osteoarthritis) of knee 05/30/2020    Past Surgical History:  Procedure Laterality Date  . TUBAL LIGATION       OB History   No obstetric history on file.     No family history on file.  Social History   Tobacco Use  . Smoking status: Former Games developer  . Smokeless tobacco: Never Used  Vaping Use  . Vaping Use: Never used  Substance Use Topics  . Alcohol use: No  . Drug use: No    Home Medications Prior to Admission medications   Medication Sig Start Date End Date Taking? Authorizing Provider  Diclofenac Sodium (PENNSAID) 2 % SOLN Place 1 application onto the skin 2 (two) times daily. 07/06/20   Myra Rude, MD  hydrochlorothiazide (HYDRODIURIL) 25 MG tablet Take 25 mg by mouth daily.    [provider]  ketoconazole (NIZORAL) 2 % cream Apply 1 application topically daily. Apply to the outside of the genitals and the groin. 11/22/15   Arthor Captain, PA-C    Allergies    Penicillins  Review of Systems   Review of Systems  Constitutional: Negative for fever.  Gastrointestinal: Negative for abdominal pain.  Genitourinary: Positive for dysuria and vaginal discharge. Negative for  pelvic pain and vaginal pain.  Musculoskeletal: Negative for back pain.  Skin: Negative for rash and wound.    Physical Exam Updated Vital Signs BP (!) 158/79 (BP Location: Left Arm)   Pulse 64   Temp 98.7 F (37.1 C) (Oral)   Resp 18   LMP 11/17/2015   SpO2 100%   Physical Exam Vitals and nursing note reviewed.  Constitutional:      General: She is not in acute distress.    Appearance: She is well-developed. She is not diaphoretic.  HENT:     Head: Normocephalic and atraumatic.  Cardiovascular:     Rate and Rhythm: Normal rate and regular rhythm.     Pulses: Normal pulses.     Heart sounds: Normal heart sounds.  Pulmonary:     Effort: Pulmonary effort is normal.     Breath sounds: Normal breath sounds.  Abdominal:     Palpations: Abdomen is soft.     Tenderness: There is no abdominal tenderness. There is no right CVA tenderness or left CVA tenderness.  Skin:    General: Skin is warm and dry.     Findings: No erythema or rash.  Neurological:     Mental Status: She is alert and oriented to person, place, and time.  Psychiatric:        Behavior: Behavior normal.     ED Results / Procedures / Treatments   Labs (all  labs ordered are listed, but only abnormal results are displayed) Labs Reviewed  WET PREP, GENITAL - Abnormal; Notable for the following components:      Result Value   WBC, Wet Prep HPF POC FEW (*)    All other components within normal limits  URINALYSIS, ROUTINE W REFLEX MICROSCOPIC  GC/CHLAMYDIA PROBE AMP () NOT AT [facility]    EKG None  Radiology No results found.  Procedures Procedures   Medications Ordered in ED Medications - No data to display  ED Course  I have reviewed the triage vital signs and the nursing notes.  Pertinent labs & imaging results that were available during my care of the patient were reviewed by me and considered in my medical decision making (see chart for details).  Clinical Course as of 08/09/20 1618   Tue Aug 09, 2020  4749 50 year old female presents with request for STD testing with complaints of dysuria and vaginal itching with thick white discharge.  She denies abdominal pain, fever, other systemic symptoms.  On exam, she is well-appearing, no CVA tenderness and abdomen is soft and nontender. Urinalysis is within normal limits, wet prep is negative for trichomoniasis, BV, yeast. Urine is also sent out for gonorrhea and Chlamydia testing and patient is advised to follow-up in her MyChart account for these results. Offered reassurance and advised to follow-up with her PCP, gynecologist or women's clinic if her symptoms persist. [LM]    Clinical Course User Index [LM] Alden Hipp   MDM Rules/Calculators/A&P                          Final Clinical Impression(s) / ED Diagnoses Final diagnoses:  Vaginal discharge  Dysuria    Rx / DC Orders ED Discharge Orders    None       Jeannie Fend, PA-C 08/09/20 1618    Milagros Loll, MD 08/09/20 2241

## 2020-08-10 LAB — GC/CHLAMYDIA PROBE AMP (~~LOC~~) NOT AT ARMC
Chlamydia: NEGATIVE
Comment: NEGATIVE
Comment: NORMAL
Neisseria Gonorrhea: NEGATIVE

## 2020-08-16 ENCOUNTER — Encounter: Payer: Self-pay | Admitting: *Deleted

## 2020-08-29 ENCOUNTER — Other Ambulatory Visit (HOSPITAL_BASED_OUTPATIENT_CLINIC_OR_DEPARTMENT_OTHER): Payer: Self-pay

## 2020-10-15 ENCOUNTER — Emergency Department (HOSPITAL_BASED_OUTPATIENT_CLINIC_OR_DEPARTMENT_OTHER)
Admission: EM | Admit: 2020-10-15 | Discharge: 2020-10-15 | Disposition: A | Discharge status: Home / Self Care | Payer: BC Managed Care – PPO | Attending: Emergency Medicine | Admitting: Emergency Medicine

## 2020-10-15 ENCOUNTER — Encounter (HOSPITAL_BASED_OUTPATIENT_CLINIC_OR_DEPARTMENT_OTHER): Payer: Self-pay | Admitting: Emergency Medicine

## 2020-10-15 ENCOUNTER — Other Ambulatory Visit: Payer: Self-pay

## 2020-10-15 DIAGNOSIS — Z79899 Other long term (current) drug therapy: Secondary | ICD-10-CM | POA: Insufficient documentation

## 2020-10-15 DIAGNOSIS — I10 Essential (primary) hypertension: Secondary | ICD-10-CM | POA: Insufficient documentation

## 2020-10-15 DIAGNOSIS — M25461 Effusion, right knee: Secondary | ICD-10-CM | POA: Insufficient documentation

## 2020-10-15 DIAGNOSIS — M25561 Pain in right knee: Secondary | ICD-10-CM | POA: Diagnosis present

## 2020-10-15 DIAGNOSIS — Z87891 Personal history of nicotine dependence: Secondary | ICD-10-CM | POA: Insufficient documentation

## 2020-10-15 MED ORDER — KETOROLAC TROMETHAMINE 30 MG/ML IJ SOLN
30.0000 mg | Freq: Once | INTRAMUSCULAR | Status: AC
  Administered 2020-10-15: 30 mg via INTRAMUSCULAR
  Filled 2020-10-15: qty 1

## 2020-10-15 MED ORDER — MELOXICAM 7.5 MG PO TABS: 7.5000 mg | ORAL_TABLET | Freq: Every day | ORAL | 0 refills | Status: DC

## 2020-10-15 NOTE — ED Triage Notes (Signed)
Pt arrives pov with c/o R knee pain and swelling. Pt reports anterior and posterior pain. Pt denies injury, reports using voltaren gel and taking tylenol with little relief

## 2020-10-15 NOTE — ED Provider Notes (Signed)
[facility] EMERGENCY DEPARTMENT Provider Note   CSN: [id number] Arrival date & time: 10/15/20  1618     History Chief Complaint  Patient presents with   Knee Pain    Amy Watts is a 50 y.o. female.  Patient presents the emergency department today for evaluation of right knee pain.  She has a history of arthritis including torn meniscus in this knee demonstrated on MRI in March of 2022.  She has received injections in the knee, last injection in May.  She had good results from this up until that a week ago and the knee became more painful.  She states that she works long hours, sometimes 12 hours at a time, which she thinks is contributing.  No fevers.  She has taking Tylenol without improvement.  No acute injuries.  She is able to bear weight.  She has had the knee wrapped at times.      Past Medical History:  Diagnosis Date   Hypertension    Plaque psoriasis     Patient Active Problem List   Diagnosis Date Noted   OA (osteoarthritis) of knee 05/30/2020    Past Surgical History:  Procedure Laterality Date   TUBAL LIGATION       OB History   No obstetric history on file.     History reviewed. No pertinent family history.  Social History   Tobacco Use   Smoking status: Former    Pack years: 0.00   Smokeless tobacco: Never  Vaping Use   Vaping Use: Never used  Substance Use Topics   Alcohol use: No   Drug use: No    Home Medications Prior to Admission medications   Medication Sig Start Date End Date Taking? Authorizing Provider  meloxicam (MOBIC) 7.5 MG tablet Take 1 tablet (7.5 mg total) by mouth daily. 10/15/20  Yes Renne Crigler, PA-C  Diclofenac Sodium (PENNSAID) 2 % SOLN Place 1 application onto the skin 2 (two) times daily. 07/06/20   Myra Rude, MD  hydrochlorothiazide (HYDRODIURIL) 25 MG tablet Take 25 mg by mouth daily.    [provider]  ketoconazole (NIZORAL) 2 % cream Apply 1 application topically daily.  Apply to the outside of the genitals and the groin. 11/22/15   Arthor Captain, PA-C    Allergies    Penicillins  Review of Systems   Review of Systems  Constitutional:  Negative for activity change.  Musculoskeletal:  Positive for arthralgias and joint swelling. Negative for back pain and neck pain.  Skin:  Negative for wound.  Neurological:  Negative for weakness and numbness.   Physical Exam Updated Vital Signs BP (!) 136/59 (BP Location: Right Arm)   Pulse 68   Temp 98.9 F (37.2 C)   Resp 18   Ht 5\' 4"  (1.626 m)   Wt 81.6 kg   LMP 11/17/2015   SpO2 96%   BMI 30.90 kg/m   Physical Exam Vitals and nursing note reviewed.  Constitutional:      Appearance: She is well-developed.  HENT:     Head: Normocephalic and atraumatic.  Eyes:     Pupils: Pupils are equal, round, and reactive to light.  Cardiovascular:     Pulses: Normal pulses. No decreased pulses.  Musculoskeletal:        General: Tenderness present.     Cervical back: Normal range of motion and neck supple.     Right hip: Normal range of motion.     Right knee: No bony  tenderness. Normal range of motion. Tenderness present over the lateral joint line.     Right ankle: No tenderness. Normal range of motion. Normal pulse (2+ DP).  Skin:    General: Skin is warm and dry.  Neurological:     Mental Status: She is alert.     Sensory: No sensory deficit.     Comments: Motor, sensation, and vascular distal to the injury is fully intact.   Psychiatric:        Mood and Affect: Mood normal.    ED Results / Procedures / Treatments   Labs (all labs ordered are listed, but only abnormal results are displayed) Labs Reviewed - No data to display  EKG None  Radiology No results found.  Procedures Procedures   Medications Ordered in ED Medications  ketorolac (TORADOL) 30 MG/ML injection 30 mg (has no administration in time range)    ED Course  I have reviewed the triage vital signs and the nursing  notes.  Pertinent labs & imaging results that were available during my care of the patient were reviewed by me and considered in my medical decision making (see chart for details).  Patient seen and examined.  Plan: IM Toradol, p.o. meloxicam, sports medicine follow-up.  Vital signs reviewed and are as follows: BP (!) 136/59 (BP Location: Right Arm)   Pulse 68   Temp 98.9 F (37.2 C)   Resp 18   Ht 5\' 4"  (1.626 m)   Wt 81.6 kg   LMP 11/17/2015   SpO2 96%   BMI 30.90 kg/m   Patient urged to return with worsening symptoms or other concerns. Patient verbalized understanding and agrees with plan.     MDM Rules/Calculators/A&P                          Patient with right knee pain, mild effusion.  History of degenerative changes in the knee treated by sports medicine.  Suspect that she is having a flare of her chronic issues.  She looks well, nontoxic.  Doubt gout.  Very low concern for septic arthritis.  Treatment plan as above.  Remainder of lower extremity is neurovascularly intact.    Final Clinical Impression(s) / ED Diagnoses Final diagnoses:  Effusion of right knee    Rx / DC Orders ED Discharge Orders          Ordered    meloxicam (MOBIC) 7.5 MG tablet  Daily        10/15/20 1650             12/16/20, PA-C 10/15/20 1654    Long, 12/16/20, MD 10/15/20 1755

## 2020-10-15 NOTE — ED Notes (Signed)
Pt provided discharge instructions and prescription information. Pt was given the opportunity to ask questions and questions were answered. Discharge signature not obtained in the setting of the COVID-19 pandemic in order to reduce high touch surfaces.  ° °

## 2020-10-15 NOTE — Discharge Instructions (Addendum)
Please read and follow all provided instructions.  Your diagnoses today include:  1. Effusion of right knee     Tests performed today include: Vital signs. See below for your results today.   Medications prescribed:  Meloxicam - anti-inflammatory pain medication  You have been prescribed an anti-inflammatory medication or NSAID. Take with food. Do not take aspirin, ibuprofen, or naproxen if taking this medication. Take smallest effective dose for the shortest duration needed for your pain. Stop taking if you experience stomach pain or vomiting.   Take any prescribed medications only as directed.  Home care instructions:  Follow any educational materials contained in this packet Follow R.I.C.E. Protocol: R - rest your injury  I  - use ice on injury without applying directly to skin C - compress injury with bandage or splint E - elevate the injury as much as possible  Follow-up instructions: Please follow-up with your sports medicine provider in the next week.   Return instructions:  Please return if your toes or feet are numb or tingling, appear gray or blue, or you have severe pain (also elevate the leg and loosen splint or wrap if you were given one) Please return to the Emergency Department if you experience worsening symptoms.  Please return if you have any other emergent concerns.  Additional Information:  Your vital signs today were: BP (!) 136/59 (BP Location: Right Arm)   Pulse 68   Temp 98.9 F (37.2 C)   Resp 18   Ht 5\' 4"  (1.626 m)   Wt 81.6 kg   LMP 11/17/2015   SpO2 96%   BMI 30.90 kg/m  If your blood pressure (BP) was elevated above 135/85 this visit, please have this repeated by your doctor within one month. --------------

## 2020-10-20 ENCOUNTER — Encounter: Payer: Self-pay | Admitting: Family Medicine

## 2020-10-20 ENCOUNTER — Other Ambulatory Visit: Payer: Self-pay

## 2020-10-20 ENCOUNTER — Ambulatory Visit (INDEPENDENT_AMBULATORY_CARE_PROVIDER_SITE_OTHER): Payer: BC Managed Care – PPO | Admitting: Family Medicine

## 2020-10-20 VITALS — BP 160/96 | Ht 64.0 in | Wt 180.0 lb

## 2020-10-20 DIAGNOSIS — M1711 Unilateral primary osteoarthritis, right knee: Secondary | ICD-10-CM

## 2020-10-20 NOTE — Progress Notes (Signed)
  Amy Watts - 50 y.o. female MRN [id number]  Date of birth: 23-Jun-1970  SUBJECTIVE:  Including CC & ROS.  No chief complaint on file.   Amy Watts is a 50 y.o. female that is presenting with acute on chronic right knee pain.  Her knee was exacerbated with prolonged standing at work.  She seems to have similar pain to what she has felt previously.  She has started working 12-hour shifts which seems to exacerbate her pain..   Review of Systems See HPI   HISTORY: Past Medical, Surgical, Social, and Family History Reviewed & Updated per EMR.   Pertinent Historical Findings include:  Past Medical History:  Diagnosis Date   Hypertension    Plaque psoriasis     Past Surgical History:  Procedure Laterality Date   TUBAL LIGATION      History reviewed. No pertinent family history.  Social History   Socioeconomic History   Marital status: Single    Spouse name: Not on file   Number of children: Not on file   Years of education: Not on file   Highest education level: Not on file  Occupational History   Not on file  Tobacco Use   Smoking status: Former    Pack years: 0.00   Smokeless tobacco: Never  Vaping Use   Vaping Use: Never used  Substance and Sexual Activity   Alcohol use: No   Drug use: No   Sexual activity: Not on file  Other Topics Concern   Not on file  Social History Narrative   ** Merged History Encounter **       Social Determinants of Health   Financial Resource Strain: Not on file  Food Insecurity: Not on file  Transportation Needs: Not on file  Physical Activity: Not on file  Stress: Not on file  Social Connections: Not on file  Intimate Partner Violence: Not on file     PHYSICAL EXAM:  VS: BP (!) 160/96 (BP Location: Left Arm, Patient Position: Sitting, Cuff Size: Large)   Ht 5\' 4"  (1.626 m)   Wt 180 lb (81.6 kg)   LMP 11/17/2015   BMI 30.90 kg/m  Physical Exam Gen: NAD, alert, cooperative with exam,  well-appearing MSK:  Right knee: Mild effusion. Normal range of motion. Tenderness to palpation of the medial joint space. Neurovascular intact     ASSESSMENT & PLAN:   OA (osteoarthritis) of knee Acute on chronic in nature.  Was seen in the emergency department recently. -Counseled on home exercise therapy and supportive care. -Counseled on taking meloxicam. -Could pursue PRP or Zilretta. -Provided work note.

## 2020-10-20 NOTE — Assessment & Plan Note (Signed)
Acute on chronic in nature.  Was seen in the emergency department recently. -Counseled on home exercise therapy and supportive care. -Counseled on taking meloxicam. -Could pursue PRP or Zilretta. -Provided work note.

## 2020-10-20 NOTE — Patient Instructions (Signed)
Good to see you Please try ice  Please let me know about the zilretta  You can take the mobic twice daily if needed  Please send me a message in MyChart with any questions or updates.  Please see me back in 4 weeks.   --Dr. Jordan Likes

## 2020-11-21 ENCOUNTER — Ambulatory Visit (INDEPENDENT_AMBULATORY_CARE_PROVIDER_SITE_OTHER): Payer: BC Managed Care – PPO | Admitting: Family Medicine

## 2020-11-21 ENCOUNTER — Other Ambulatory Visit: Payer: Self-pay

## 2020-11-21 VITALS — Ht 64.0 in | Wt 180.0 lb

## 2020-11-21 DIAGNOSIS — M1711 Unilateral primary osteoarthritis, right knee: Secondary | ICD-10-CM | POA: Diagnosis not present

## 2020-11-21 NOTE — Assessment & Plan Note (Signed)
Doing well if able to limited hours that she is on her feet. -Counseled on home exercise therapy and supportive care. -Provided work note. -Could consider PRP or Zilretta.

## 2020-11-21 NOTE — Patient Instructions (Signed)
Good to see you Please try physical therapy   Please send me a message in MyChart with any questions or updates.  Please see me back in 4 weeks.   --Dr. Obelia Bonello  

## 2020-11-21 NOTE — Progress Notes (Signed)
  Amy Watts - 50 y.o. female MRN [id number]  Date of birth: 14-Apr-1971  SUBJECTIVE:  Including CC & ROS.  No chief complaint on file.   Amy Watts is a 50 y.o. female that is following up for her right knee pain.  She does well if she can still limit the amount of hours that she works.  Has been using the knee brace.    Review of Systems See HPI   HISTORY: Past Medical, Surgical, Social, and Family History Reviewed & Updated per EMR.   Pertinent Historical Findings include:  Past Medical History:  Diagnosis Date   Hypertension    Plaque psoriasis     Past Surgical History:  Procedure Laterality Date   TUBAL LIGATION      No family history on file.  Social History   Socioeconomic History   Marital status: Single    Spouse name: Not on file   Number of children: Not on file   Years of education: Not on file   Highest education level: Not on file  Occupational History   Not on file  Tobacco Use   Smoking status: Former   Smokeless tobacco: Never  Vaping Use   Vaping Use: Never used  Substance and Sexual Activity   Alcohol use: No   Drug use: No   Sexual activity: Not on file  Other Topics Concern   Not on file  Social History Narrative   ** Merged History Encounter **       Social Determinants of Health   Financial Resource Strain: Not on file  Food Insecurity: Not on file  Transportation Needs: Not on file  Physical Activity: Not on file  Stress: Not on file  Social Connections: Not on file  Intimate Partner Violence: Not on file     PHYSICAL EXAM:  VS: Ht 5\' 4"  (1.626 m)   Wt 180 lb (81.6 kg)   LMP 11/17/2015   BMI 30.90 kg/m  Physical Exam Gen: NAD, alert, cooperative with exam, well-appearing    ASSESSMENT & PLAN:   OA (osteoarthritis) of knee Doing well if able to limited hours that she is on her feet. -Counseled on home exercise therapy and supportive care. -Provided work note. -Could consider PRP or  Zilretta.

## 2020-12-20 ENCOUNTER — Telehealth: Payer: Self-pay | Admitting: Family Medicine

## 2020-12-20 ENCOUNTER — Encounter: Payer: Self-pay | Admitting: Family Medicine

## 2020-12-20 ENCOUNTER — Other Ambulatory Visit: Payer: Self-pay

## 2020-12-20 ENCOUNTER — Ambulatory Visit (INDEPENDENT_AMBULATORY_CARE_PROVIDER_SITE_OTHER): Payer: BC Managed Care – PPO | Admitting: Family Medicine

## 2020-12-20 VITALS — Ht 64.0 in | Wt 180.0 lb

## 2020-12-20 DIAGNOSIS — M1711 Unilateral primary osteoarthritis, right knee: Secondary | ICD-10-CM

## 2020-12-20 MED ORDER — MELOXICAM 7.5 MG PO TABS: 7.5000 mg | ORAL_TABLET | Freq: Every day | ORAL | 0 refills | Status: DC

## 2020-12-20 NOTE — Progress Notes (Signed)
  Amy Watts - 50 y.o. female MRN [id number]  Date of birth: 1971-02-22  SUBJECTIVE:  Including CC & ROS.  No chief complaint on file.   Amy Watts is a 50 y.o. female that is presenting with ongoing right knee pain.  She has done well with limiting her workday to 8-hour day.  She still experiences pain intermittently and stiffness in the morning.   Review of Systems See HPI   HISTORY: Past Medical, Surgical, Social, and Family History Reviewed & Updated per EMR.   Pertinent Historical Findings include:  Past Medical History:  Diagnosis Date   Hypertension    Plaque psoriasis     Past Surgical History:  Procedure Laterality Date   TUBAL LIGATION      History reviewed. No pertinent family history.  Social History   Socioeconomic History   Marital status: Single    Spouse name: Not on file   Number of children: Not on file   Years of education: Not on file   Highest education level: Not on file  Occupational History   Not on file  Tobacco Use   Smoking status: Former   Smokeless tobacco: Never  Vaping Use   Vaping Use: Never used  Substance and Sexual Activity   Alcohol use: No   Drug use: No   Sexual activity: Not on file  Other Topics Concern   Not on file  Social History Narrative   ** Merged History Encounter **       Social Determinants of Health   Financial Resource Strain: Not on file  Food Insecurity: Not on file  Transportation Needs: Not on file  Physical Activity: Not on file  Stress: Not on file  Social Connections: Not on file  Intimate Partner Violence: Not on file     PHYSICAL EXAM:  VS: Ht 5\' 4"  (1.626 m)   Wt 180 lb (81.6 kg)   LMP 11/17/2015   BMI 30.90 kg/m  Physical Exam Gen: NAD, alert, cooperative with exam, well-appearing       ASSESSMENT & PLAN:   OA (osteoarthritis) of knee Acute on chronic in nature.  Pain still occurring.  We have tried normal steroid injection as well as hyaluronic  acid. -Counseled on home exercise therapy and supportive care. -Provided work note. - pursue Zilretta injection.

## 2020-12-20 NOTE — Assessment & Plan Note (Signed)
Acute on chronic in nature.  Pain still occurring.  We have tried normal steroid injection as well as hyaluronic acid. -Counseled on home exercise therapy and supportive care. -Provided work note. - pursue Zilretta injection.

## 2020-12-20 NOTE — Telephone Encounter (Signed)
Meloxicam refill sent. See meds.

## 2020-12-20 NOTE — Telephone Encounter (Signed)
Patient says forgot to ask for to ask for Rx refill says was given to her by ED provider & it works very well w/ her knee pain .   Pt uses :   [id number] - [place], [place] - [address] Phone:  [phone]  Fax:  [phone]    --forwarding request to med asst for review w/provider.  -glh

## 2020-12-20 NOTE — Patient Instructions (Signed)
Good to see you Please continue physical therapy  We will call when the zilretta is in   Please send me a message in MyChart with any questions or updates.  Please see me back in 4 weeks.   --Dr. Jordan Likes

## 2020-12-23 ENCOUNTER — Encounter: Payer: Self-pay | Admitting: Family Medicine

## 2020-12-23 ENCOUNTER — Ambulatory Visit: Payer: Self-pay

## 2020-12-23 ENCOUNTER — Ambulatory Visit (INDEPENDENT_AMBULATORY_CARE_PROVIDER_SITE_OTHER): Payer: BC Managed Care – PPO | Admitting: Family Medicine

## 2020-12-23 ENCOUNTER — Other Ambulatory Visit: Payer: Self-pay

## 2020-12-23 VITALS — Ht 64.0 in | Wt 180.0 lb

## 2020-12-23 DIAGNOSIS — M1711 Unilateral primary osteoarthritis, right knee: Secondary | ICD-10-CM | POA: Diagnosis not present

## 2020-12-23 NOTE — Assessment & Plan Note (Signed)
Completed zilretta injection today  - could consider nerve block.

## 2020-12-23 NOTE — Progress Notes (Signed)
  Amy Watts - 50 y.o. female MRN [id number]  Date of birth: Oct 02, 1970  SUBJECTIVE:  Including CC & ROS.  No chief complaint on file.   Amy Watts is a 50 y.o. female that is here for a Zilretta injection.   Review of Systems See HPI   HISTORY: Past Medical, Surgical, Social, and Family History Reviewed & Updated per EMR.   Pertinent Historical Findings include:  Past Medical History:  Diagnosis Date   Hypertension    Plaque psoriasis     Past Surgical History:  Procedure Laterality Date   TUBAL LIGATION      History reviewed. No pertinent family history.  Social History   Socioeconomic History   Marital status: Single    Spouse name: Not on file   Number of children: Not on file   Years of education: Not on file   Highest education level: Not on file  Occupational History   Not on file  Tobacco Use   Smoking status: Former   Smokeless tobacco: Never  Vaping Use   Vaping Use: Never used  Substance and Sexual Activity   Alcohol use: No   Drug use: No   Sexual activity: Not on file  Other Topics Concern   Not on file  Social History Narrative   ** Merged History Encounter **       Social Determinants of Health   Financial Resource Strain: Not on file  Food Insecurity: Not on file  Transportation Needs: Not on file  Physical Activity: Not on file  Stress: Not on file  Social Connections: Not on file  Intimate Partner Violence: Not on file     PHYSICAL EXAM:  VS: Ht 5\' 4"  (1.626 m)   Wt 180 lb (81.6 kg)   LMP 11/17/2015   BMI 30.90 kg/m  Physical Exam Gen: NAD, alert, cooperative with exam, well-appearing    Aspiration/Injection Procedure Note Amy Watts 1970-07-01  Procedure: Injection Indications: Right knee pain  Procedure Details Consent: Risks of procedure as well as the alternatives and risks of each were explained to the (patient/caregiver).  Consent for procedure obtained. Time Out:  Verified patient identification, verified procedure, site/side was marked, verified correct patient position, special equipment/implants available, medications/allergies/relevent history reviewed, required imaging and test results available.  Performed.  The area was cleaned with iodine and alcohol swabs.    The right knee superior lateral suprapatellar pouch was injected using 3 cc of 1% lidocaine on a 25-gauge 1-1/2 inch needle.  An 18-gauge 1-1/2 needle was used to achieve aspiration.  The syringe was switched and a mixture containing 5 cc's of 32 mg Zilretta and 4 cc's of 0.25% bupivacaine was injected.  Ultrasound was used. Images were obtained in long views showing the injection.    A sterile dressing was applied.  Patient did tolerate procedure well.        ASSESSMENT & PLAN:   OA (osteoarthritis) of knee Completed zilretta injection today  - could consider nerve block.

## 2020-12-23 NOTE — Patient Instructions (Signed)
Good to see you Please use ice as needed   Please send me a message in MyChart with any questions or updates.  Please see me back in 6-8 weeks.   --Dr. Marius Betts  

## 2021-01-16 ENCOUNTER — Other Ambulatory Visit: Payer: Self-pay | Admitting: Family Medicine

## 2021-01-17 ENCOUNTER — Ambulatory Visit: Payer: BC Managed Care – PPO | Admitting: Family Medicine

## 2021-02-10 ENCOUNTER — Ambulatory Visit (INDEPENDENT_AMBULATORY_CARE_PROVIDER_SITE_OTHER): Payer: BC Managed Care – PPO | Admitting: Family Medicine

## 2021-02-10 ENCOUNTER — Encounter: Payer: Self-pay | Admitting: Family Medicine

## 2021-02-10 DIAGNOSIS — M1711 Unilateral primary osteoarthritis, right knee: Secondary | ICD-10-CM

## 2021-02-10 NOTE — Progress Notes (Signed)
  Amy Watts - 50 y.o. female MRN [id number]  Date of birth: [birth year]  SUBJECTIVE:  Including CC & ROS.  No chief complaint on file.   Amy Watts is a 50 y.o. female that is following up for her knee pain.  She is doing well and is getting improvement with physical therapy.   Review of Systems See HPI   HISTORY: Past Medical, Surgical, Social, and Family History Reviewed & Updated per EMR.   Pertinent Historical Findings include:  Past Medical History:  Diagnosis Date   Hypertension    Plaque psoriasis     Past Surgical History:  Procedure Laterality Date   TUBAL LIGATION      History reviewed. No pertinent family history.  Social History   Socioeconomic History   Marital status: Single    Spouse name: Not on file   Number of children: Not on file   Years of education: Not on file   Highest education level: Not on file  Occupational History   Not on file  Tobacco Use   Smoking status: Former   Smokeless tobacco: Never  Vaping Use   Vaping Use: Never used  Substance and Sexual Activity   Alcohol use: No   Drug use: No   Sexual activity: Not on file  Other Topics Concern   Not on file  Social History Narrative   ** Merged History Encounter **       Social Determinants of Health   Financial Resource Strain: Not on file  Food Insecurity: Not on file  Transportation Needs: Not on file  Physical Activity: Not on file  Stress: Not on file  Social Connections: Not on file  Intimate Partner Violence: Not on file     PHYSICAL EXAM:  VS: BP 140/90 (BP Location: Left Arm, Patient Position: Sitting)   Ht 5\' 4"  (1.626 m)   Wt 180 lb (81.6 kg)   LMP 11/17/2015   BMI 30.90 kg/m  Physical Exam Gen: NAD, alert, cooperative with exam, well-appearing      ASSESSMENT & PLAN:   OA (osteoarthritis) of knee Doing well with the injection and with physical therapy. -Counseled on home exercise therapy and supportive care. -Continue  physical therapy. -Follow-up as needed.

## 2021-02-10 NOTE — Assessment & Plan Note (Signed)
Doing well with the injection and with physical therapy. -Counseled on home exercise therapy and supportive care. -Continue physical therapy. -Follow-up as needed.

## 2021-03-03 ENCOUNTER — Ambulatory Visit (INDEPENDENT_AMBULATORY_CARE_PROVIDER_SITE_OTHER): Payer: BC Managed Care – PPO | Admitting: Family Medicine

## 2021-03-03 ENCOUNTER — Encounter: Payer: Self-pay | Admitting: Family Medicine

## 2021-03-03 ENCOUNTER — Ambulatory Visit: Payer: Self-pay

## 2021-03-03 VITALS — BP 160/80 | Ht 64.0 in | Wt 180.0 lb

## 2021-03-03 DIAGNOSIS — M1711 Unilateral primary osteoarthritis, right knee: Secondary | ICD-10-CM | POA: Diagnosis not present

## 2021-03-03 MED ORDER — KETOROLAC TROMETHAMINE 30 MG/ML IJ SOLN
30.0000 mg | Freq: Once | INTRAMUSCULAR | Status: AC
  Administered 2021-03-03: 30 mg via INTRA_ARTICULAR

## 2021-03-03 NOTE — Addendum Note (Signed)
Addended by: Merrilyn Puma on: 03/03/2021 11:28 AM   Modules accepted: Orders

## 2021-03-03 NOTE — Progress Notes (Signed)
Amy Watts - 50 y.o. female MRN [id number]  Date of birth: [birth year]  SUBJECTIVE:  Including CC & ROS.  No chief complaint on file.   Amy Watts is a 50 y.o. female that is presenting with acute worsening of her right knee pain.  We have tried different steroid injections as well as gel injections and bracing.  Acutely worsened with being up on her feet.   Review of Systems See HPI   HISTORY: Past Medical, Surgical, Social, and Family History Reviewed & Updated per EMR.   Pertinent Historical Findings include:  Past Medical History:  Diagnosis Date   Hypertension    Plaque psoriasis     Past Surgical History:  Procedure Laterality Date   TUBAL LIGATION      History reviewed. No pertinent family history.  Social History   Socioeconomic History   Marital status: Single    Spouse name: Not on file   Number of children: Not on file   Years of education: Not on file   Highest education level: Not on file  Occupational History   Not on file  Tobacco Use   Smoking status: Former   Smokeless tobacco: Never  Vaping Use   Vaping Use: Never used  Substance and Sexual Activity   Alcohol use: No   Drug use: No   Sexual activity: Not on file  Other Topics Concern   Not on file  Social History Narrative   ** Merged History Encounter **       Social Determinants of Health   Financial Resource Strain: Not on file  Food Insecurity: Not on file  Transportation Needs: Not on file  Physical Activity: Not on file  Stress: Not on file  Social Connections: Not on file  Intimate Partner Violence: Not on file     PHYSICAL EXAM:  VS: BP (!) 160/80 (BP Location: Left Arm, Patient Position: Sitting)   Ht 5\' 4"  (1.626 m)   Wt 180 lb (81.6 kg)   LMP 11/17/2015   BMI 30.90 kg/m  Physical Exam Gen: NAD, alert, cooperative with exam, well-appearing    Aspiration/Injection Procedure Note Amy Watts 1970/04/19  Procedure:  Aspiration and Injection Indications: Right knee pain  Procedure Details Consent: Risks of procedure as well as the alternatives and risks of each were explained to the (patient/caregiver).  Consent for procedure obtained. Time Out: Verified patient identification, verified procedure, site/side was marked, verified correct patient position, special equipment/implants available, medications/allergies/relevent history reviewed, required imaging and test results available.  Performed.  The area was cleaned with iodine and alcohol swabs.    The right knee superior lateral suprapatellar pouch was injected using 3 cc 1% lidocaine on a 25-gauge 1-1/2 inch needle.  An 18-gauge 1-1/2 inch needle was used to achieve aspiration.  The syringe was switched and a mixture containing 1 cc's of 30 mg Toradol and 4 cc's of 0.25% bupivacaine was injected.  Ultrasound was used. Images were obtained in long views showing the injection.    Amount of Fluid Aspirated:  76mL Character of Fluid: clear and straw colored Fluid was sent for: n/a  A sterile dressing was applied.  Patient did tolerate procedure well.      ASSESSMENT & PLAN:   OA (osteoarthritis) of knee Acutely worsening of her pain.  Previous MRI was demonstrating degenerative changes.  We have tried steroid injections repetitively as well as gel injections in symptoms continue to exacerbate. -Counseled on home exercise therapy and supportive care. -Provided  work note. -Aspiration injection. -Referral to orthopedist.

## 2021-03-03 NOTE — Assessment & Plan Note (Signed)
Acutely worsening of her pain.  Previous MRI was demonstrating degenerative changes.  We have tried steroid injections repetitively as well as gel injections in symptoms continue to exacerbate. -Counseled on home exercise therapy and supportive care. -Provided work note. -Aspiration injection. -Referral to orthopedist.

## 2021-03-03 NOTE — Patient Instructions (Signed)
Good to see you Please use ice as needed  We've made a referral to the orthopedists   Please send me a message in MyChart with any questions or updates.  Please see me back 6-8 weeks or as needed.   --Dr. Jordan Likes

## 2021-04-14 ENCOUNTER — Ambulatory Visit: Payer: BC Managed Care – PPO | Admitting: Family Medicine

## 2022-07-31 ENCOUNTER — Encounter: Payer: Self-pay | Admitting: *Deleted
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
## 2023-02-21 ENCOUNTER — Other Ambulatory Visit (HOSPITAL_COMMUNITY): Payer: Self-pay
